# Patient Record
Sex: Male | Born: 1990
Health system: Southern US, Community
[De-identification: ages and names within clinical notes are randomized; demographics above are authoritative.]

## PROBLEM LIST (undated history)

## (undated) HISTORY — PX: FRACTURE SURGERY: SHX138

## (undated) HISTORY — PX: MANDIBLE FRACTURE SURGERY: SHX706

## (undated) HISTORY — PX: NECK SURGERY: SHX720

---

## 2015-03-02 ENCOUNTER — Encounter (HOSPITAL_COMMUNITY): Payer: Self-pay | Admitting: Emergency Medicine

## 2015-03-02 ENCOUNTER — Emergency Department (HOSPITAL_COMMUNITY)
Admission: EM | Admit: 2015-03-02 | Discharge: 2015-03-02 | Disposition: A | Discharge status: Home / Self Care | Payer: Self-pay | Attending: Emergency Medicine | Admitting: Emergency Medicine

## 2015-03-02 DIAGNOSIS — B356 Tinea cruris: Secondary | ICD-10-CM | POA: Insufficient documentation

## 2015-03-02 MED ORDER — TERBINAFINE HCL 250 MG PO TABS: 250.0000 mg | ORAL_TABLET | Freq: Every day | ORAL | Status: AC
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
## 2015-03-02 NOTE — ED Notes (Addendum)
Pt from home with c/o groin rash that is red and irritated > 1 month.  Pt states he spoke with his PCP who recommended a jock itch cream without looking at the area.  Pt states the cream helped some at first but now showering increased pain.  Pt states there are areas that are scarred now, unsure of any bumps or lesions. Pt in NAD, A&O.

## 2015-03-02 NOTE — Discharge Instructions (Signed)
Jock Itch  Jock itch (tinea cruris) is a fungal infection of the skin in the groin area. It is sometimes called ringworm, even though it is not caused by worms. It is caused by a fungus, which is a type of germ that thrives in dark, damp places. Jock itch causes a rash and itching in the groin and upper thigh area. It usually goes away in 2-3 weeks with treatment.  CAUSES  The fungus that causes jock itch may be spread by:  · Touching a fungus infection elsewhere on your body--such as athlete's foot--and then touching your groin area.  · Sharing towels or clothing with an infected person.  RISK FACTORS  Jock itch is most common in men and adolescent boys. This condition is more likely to develop from:  · Being in hot, humid climates.  · Wearing tight-fitting clothing or wet bathing suits for long periods of time.  · Participating in sports.  · Being overweight.  · Having diabetes.  SYMPTOMS  Symptoms of jock itch may include:  · A red, pink, or brown rash in the groin area. The rash may spread to the thighs, anus, and buttocks.  · Dry and scaly skin on or around the rash.  · Itchiness.  DIAGNOSIS  Most often, a health care provider can make the diagnosis by looking at your rash. Sometimes, a scraping of the infected skin will be taken. This sample may be tested by looking at it under a microscope or by trying to grow the fungus from the sample (culture).   TREATMENT  Treatment for this condition may include:  · Antifungal medicine to kill the fungus. This may be in various forms:    Skin cream or ointment.    Medicine taken by mouth.  · Skin cream or ointment to reduce the itching.  · Compresses or medicated powders to dry the infected skin.  HOME CARE INSTRUCTIONS  · Take medicines only as directed by your health care provider. Apply skin creams or ointments exactly as directed.  · Wear loose-fitting clothing.    Men should wear cotton boxer shorts.    Women should wear cotton underwear.  · Change your underwear  every day to keep your groin dry.  · Avoid hot baths.  · Dry your groin area well after bathing.    Use a separate towel to dry your groin area. This will help to prevent a spreading of the infection to other areas of your body.  · Do not scratch the affected area.  · Do not share towels with other people.  SEEK MEDICAL CARE IF:  · Your rash does not improve or it gets worse after 2 weeks of treatment.  · Your rash is spreading.  · Your rash returns after treatment is finished.  · You have a fever.  · You have redness, swelling, or pain in the area around your rash.  · You have fluid, blood, or pus coming from your rash.  · Your have your rash for more than 4 weeks.     This information is not intended to replace advice given to you by your health care provider. Make sure you discuss any questions you have with your health care provider.     Document Released: 03/17/2002 Document Revised: 04/17/2014 Document Reviewed: 01/06/2014  Elsevier Interactive Patient Education ©2016 [employer]

## 2017-04-06 ENCOUNTER — Emergency Department (HOSPITAL_COMMUNITY): Payer: Self-pay

## 2017-04-06 ENCOUNTER — Other Ambulatory Visit: Payer: Self-pay

## 2017-04-06 ENCOUNTER — Encounter (HOSPITAL_COMMUNITY): Payer: Self-pay | Admitting: Emergency Medicine

## 2017-04-06 DIAGNOSIS — Z5321 Procedure and treatment not carried out due to patient leaving prior to being seen by health care provider: Secondary | ICD-10-CM | POA: Insufficient documentation
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
## 2017-04-07 ENCOUNTER — Encounter (HOSPITAL_COMMUNITY): Payer: Self-pay | Admitting: *Deleted

## 2017-04-07 ENCOUNTER — Emergency Department (HOSPITAL_COMMUNITY)
Admission: EM | Admit: 2017-04-07 | Discharge: 2017-04-07 | Disposition: A | Discharge status: Home / Self Care | Payer: Self-pay | Attending: Emergency Medicine | Admitting: Emergency Medicine

## 2017-04-07 ENCOUNTER — Other Ambulatory Visit: Payer: Self-pay

## 2017-04-07 DIAGNOSIS — S60221A Contusion of right hand, initial encounter: Secondary | ICD-10-CM | POA: Insufficient documentation

## 2017-04-07 DIAGNOSIS — W228XXA Striking against or struck by other objects, initial encounter: Secondary | ICD-10-CM | POA: Insufficient documentation

## 2017-04-07 DIAGNOSIS — Z79899 Other long term (current) drug therapy: Secondary | ICD-10-CM | POA: Insufficient documentation

## 2017-04-07 DIAGNOSIS — Y929 Unspecified place or not applicable: Secondary | ICD-10-CM | POA: Insufficient documentation

## 2017-04-07 DIAGNOSIS — Y939 Activity, unspecified: Secondary | ICD-10-CM | POA: Insufficient documentation

## 2017-04-07 DIAGNOSIS — Y999 Unspecified external cause status: Secondary | ICD-10-CM | POA: Insufficient documentation

## 2017-04-07 MED ORDER — KETOROLAC TROMETHAMINE 30 MG/ML IJ SOLN
30.0000 mg | Freq: Once | INTRAMUSCULAR | Status: DC
  Filled 2017-04-07: qty 1

## 2017-04-07 MED ORDER — IBUPROFEN 400 MG PO TABS
400.0000 mg | ORAL_TABLET | Freq: Once | ORAL | Status: DC | PRN
  Filled 2017-04-07: qty 1

## 2017-04-07 MED ORDER — NAPROXEN 500 MG PO TABS: 500.0000 mg | ORAL_TABLET | Freq: Two times a day (BID) | ORAL | 0 refills | Status: DC
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
## 2017-04-07 NOTE — ED Notes (Signed)
Pt to desk requesting pain medication

## 2017-04-07 NOTE — ED Notes (Signed)
Pt states that he is going to leave due to wait times, states he does not want the pain medication

## 2017-04-07 NOTE — Discharge Instructions (Signed)
Please read the attached information regarding your condition. Wear wrist splint as directed, especially at night. Take naproxen as needed for pain and swelling. Continue to ice your hand to help with comfort. Follow-up with hand specialist listed below for further evaluation if symptoms persist for longer than 1 or 2 weeks. Return to ED for worsening symptoms, additional injuries, numbness in hand, hot or tender joint.

## 2017-04-07 NOTE — ED Notes (Signed)
Declined W/C at D/C and was escorted to lobby by RN. 

## 2017-05-31 ENCOUNTER — Encounter (HOSPITAL_BASED_OUTPATIENT_CLINIC_OR_DEPARTMENT_OTHER): Payer: Self-pay | Admitting: Emergency Medicine

## 2017-05-31 ENCOUNTER — Emergency Department (HOSPITAL_BASED_OUTPATIENT_CLINIC_OR_DEPARTMENT_OTHER)
Admission: EM | Admit: 2017-05-31 | Discharge: 2017-06-01 | Disposition: A | Discharge status: Home / Self Care | Payer: Self-pay | Attending: Emergency Medicine | Admitting: Emergency Medicine

## 2017-05-31 ENCOUNTER — Emergency Department (HOSPITAL_BASED_OUTPATIENT_CLINIC_OR_DEPARTMENT_OTHER): Payer: Self-pay

## 2017-05-31 ENCOUNTER — Other Ambulatory Visit: Payer: Self-pay

## 2017-05-31 DIAGNOSIS — J111 Influenza due to unidentified influenza virus with other respiratory manifestations: Secondary | ICD-10-CM | POA: Insufficient documentation

## 2017-05-31 DIAGNOSIS — R69 Illness, unspecified: Secondary | ICD-10-CM

## 2017-05-31 MED ORDER — IBUPROFEN 800 MG PO TABS
800.0000 mg | ORAL_TABLET | Freq: Once | ORAL | Status: AC
  Administered 2017-05-31: 800 mg via ORAL
  Filled 2017-05-31: qty 1

## 2017-05-31 MED ORDER — ACETAMINOPHEN 325 MG PO TABS
650.0000 mg | ORAL_TABLET | Freq: Once | ORAL | Status: AC | PRN
  Administered 2017-05-31: 650 mg via ORAL
  Filled 2017-05-31: qty 2

## 2017-05-31 MED ORDER — IBUPROFEN 800 MG PO TABS: 800.0000 mg | ORAL_TABLET | Freq: Three times a day (TID) | ORAL | 0 refills | Status: AC

## 2017-05-31 NOTE — ED Triage Notes (Signed)
Patient states that he has had generalized aches and back and neck pain. The patient reports that he had nausea as well and chills and aches

## 2017-06-01 ENCOUNTER — Encounter (HOSPITAL_BASED_OUTPATIENT_CLINIC_OR_DEPARTMENT_OTHER): Payer: Self-pay | Admitting: Emergency Medicine
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
## 2018-06-11 ENCOUNTER — Other Ambulatory Visit: Payer: Self-pay

## 2018-06-11 ENCOUNTER — Emergency Department (HOSPITAL_BASED_OUTPATIENT_CLINIC_OR_DEPARTMENT_OTHER)
Admission: EM | Admit: 2018-06-11 | Discharge: 2018-06-11 | Disposition: A | Discharge status: Home / Self Care | Payer: Self-pay | Attending: Emergency Medicine | Admitting: Emergency Medicine

## 2018-06-11 ENCOUNTER — Encounter (HOSPITAL_BASED_OUTPATIENT_CLINIC_OR_DEPARTMENT_OTHER): Payer: Self-pay | Admitting: Emergency Medicine

## 2018-06-11 DIAGNOSIS — R51 Headache: Secondary | ICD-10-CM | POA: Insufficient documentation

## 2018-06-11 DIAGNOSIS — R0981 Nasal congestion: Secondary | ICD-10-CM | POA: Insufficient documentation

## 2018-06-11 DIAGNOSIS — H938X3 Other specified disorders of ear, bilateral: Secondary | ICD-10-CM | POA: Diagnosis present

## 2018-06-11 MED ORDER — NAPROXEN 500 MG PO TABS: 500.0000 mg | ORAL_TABLET | Freq: Two times a day (BID) | ORAL | 0 refills | Status: AC

## 2018-06-11 MED ORDER — FLUTICASONE PROPIONATE 50 MCG/ACT NA SUSP: 1.0000 | Freq: Every day | NASAL | 0 refills | Status: AC

## 2018-06-11 MED FILL — FLUTICASONE PROP 50 MCG SPR: 50 | 60 days supply | Qty: 16 | Fill #0

## 2018-06-11 MED FILL — NAPROXEN 500 MG TABLET: 500 | 5 days supply | Qty: 10 | Fill #0
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
## 2018-06-11 NOTE — Discharge Instructions (Addendum)
You are seen in the emergency department for ear pressure and headache.  Your nose appears congested which we think may be causing her symptoms.  We are sending home with Flonase, and nasal steroid, to help dry up the congestion and fluid behind your eardrum.  We are also sending you with naproxen. Naproxen is a nonsteroidal anti-inflammatory medication that will help with pain and swelling. Be sure to take this medication as prescribed with food, 1 pill every 12 hours,  It should be taken with food, as it can cause stomach upset, and more seriously, stomach bleeding. Do not take other nonsteroidal anti-inflammatory medications with this such as Advil, Motrin, Aleve, Mobic, Goodie Powder, or Motrin.     You make take Tylenol per over the counter dosing with these medications.   We have prescribed you new medication(s) today. Discuss the medications prescribed today with your pharmacist as they can have adverse effects and interactions with your other medicines including over the counter and prescribed medications. Seek medical evaluation if you start to experience new or abnormal symptoms after taking one of these medicines, seek care immediately if you start to experience difficulty breathing, feeling of your throat closing, facial swelling, or rash as these could be indications of a more serious allergic reaction  Primary care provider in 3 to 5 days for reevaluation.  Return to the ER for new or worsening symptoms including but not limited to fever, worsening pain, numbness, weakness, change in vision, trouble moving your neck, or any other concerns.

## 2018-06-11 NOTE — ED Triage Notes (Signed)
Reports bilateral ear pain, right worse than left for approximately 1 week. Additionally c/o headache for 2 days.
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
